# Patient Record
Sex: Male | Born: 1960 | Race: Black or African American | Hispanic: No | Smoking: Current every day smoker
Health system: Southern US, Community
[De-identification: ages and names within clinical notes are randomized; demographics above are authoritative.]

---

## 2017-08-10 ENCOUNTER — Ambulatory Visit (INDEPENDENT_AMBULATORY_CARE_PROVIDER_SITE_OTHER): Payer: Medicaid Other | Admitting: Pulmonary Disease

## 2017-08-10 ENCOUNTER — Ambulatory Visit (HOSPITAL_BASED_OUTPATIENT_CLINIC_OR_DEPARTMENT_OTHER)
Admission: RE | Admit: 2017-08-10 | Discharge: 2017-08-10 | Disposition: A | Discharge status: Home / Self Care | Payer: Medicaid Other | Source: Ambulatory Visit | Attending: Pulmonary Disease | Admitting: Pulmonary Disease

## 2017-08-10 ENCOUNTER — Encounter: Payer: Self-pay | Admitting: Pulmonary Disease

## 2017-08-10 VITALS — BP 124/86 | HR 85 | Ht 72.0 in | Wt 216.0 lb

## 2017-08-10 DIAGNOSIS — R918 Other nonspecific abnormal finding of lung field: Secondary | ICD-10-CM | POA: Diagnosis not present

## 2017-08-10 DIAGNOSIS — J441 Chronic obstructive pulmonary disease with (acute) exacerbation: Secondary | ICD-10-CM | POA: Diagnosis not present

## 2017-08-10 MED ORDER — FLUTICASONE-UMECLIDIN-VILANT 100-62.5-25 MCG/INH IN AEPB: 1.0000 | INHALATION_SPRAY | RESPIRATORY_TRACT | 3 refills | Status: DC

## 2017-08-10 NOTE — Progress Notes (Signed)
Subjective:   PATIENT ID: Philip Berry GENDER: male DOB: Feb 08, 1961, MRN: [id number]  Synopsis: Referred in April 2019 for COPD.  He has smoked 1/2 pack of cigarettes daily for the majority of his adult life and was still smoking as of April 2019.  He had previously been seen by pulmonary medicine in [facility].  He had a CT scan in 2014 that showed emphysema and scattered calcified pulmonary nodules.  He has a distant history of inhaled crack cocaine and marijuana use.  HPI  No chief complaint on file.  Chief complaint referred by primary care physician for evaluation of COPD  Philip Berry comes to my clinic today for evaluation of his COPD.  He says that he was diagnosed in 2006.  At that time he was told that he had emphysema and there was a concern that it was related to his prior inhaled crack cocaine and marijuana use.  He tells me today that he is not used inhaled illicit drugs in over 30 years.  However, with his COPD he has struggled with episodes of pneumonia in the past and was hospitalized in 2018 for left-sided chest pain and "possible pneumonia" associated with significant shortness of breath.  He says that as a child he had asthma which she "grew out of".  He did not have childhood pneumonia or significant respiratory infections.  He says that his shortness of breath causes some significant shortness of breath on exertion.  He does not have much problems with cough.  He says that he feels better in the mornings after he smokes 1 cigarette and then he "clears out his lung".  He has been trying to eat healthy lately and exercise more.  He feels some shortness of breath when climbing hills.  He has significantly cut back on smoking recently.  He used to smoke 1/2 pack of cigarettes daily for most of his adult life but 3 weeks prior to this visit he says he cut down to just 1 cigarette in the morning.  He has used multiple inhaled medicines over the years.  Most recently he has been  using Breo and Incruise.  He says that most of the time when he has more difficulty breathing is when he decides that he does not want to take his inhaled medicines.  He is interested in trying Trelegy for ease of use and cost.  He says that albuterol has never helped him much so he does not use it.  He does not even have an albuterol inhaler right now.  History reviewed. No pertinent past medical history.   History reviewed. No pertinent family history.   Social History   Socioeconomic History  . Marital status: Legally Separated    Spouse name: Not on file  . Number of children: Not on file  . Years of education: Not on file  . Highest education level: Not on file  Occupational History  . Not on file  Social Needs  . Financial resource strain: Not on file  . Food insecurity:    Worry: Not on file    Inability: Not on file  . Transportation needs:    Medical: Not on file    Non-medical: Not on file  Tobacco Use  . Smoking status: Current Some Day Smoker    Packs/day: 0.50    Years: 43.00    Pack years: 21.50    Types: Cigarettes  . Smokeless tobacco: Never Used  Substance and Sexual Activity  .  Alcohol use: Never    Frequency: Never  . Drug use: Never  . Sexual activity: Not on file  Lifestyle  . Physical activity:    Days per week: Not on file    Minutes per session: Not on file  . Stress: Not on file  Relationships  . Social connections:    Talks on phone: Not on file    Gets together: Not on file    Attends religious service: Not on file    Active member of club or organization: Not on file    Attends meetings of clubs or organizations: Not on file    Relationship status: Not on file  . Intimate partner violence:    Fear of current or ex partner: Not on file    Emotionally abused: Not on file    Physically abused: Not on file    Forced sexual activity: Not on file  Other Topics Concern  . Not on file  Social History Narrative  . Not on file     Not on  File   Outpatient Medications Prior to Visit  Medication Sig Dispense Refill  . amlodipine-atorvastatin (CADUET) 10-10 MG tablet Take 1 tablet by mouth daily.    Marland Kitchen losartan (COZAAR) 100 MG tablet Take 100 mg by mouth daily.    Marland Kitchen omeprazole (PRILOSEC) 40 MG capsule Take 40 mg by mouth daily.    . fluticasone furoate-vilanterol (BREO ELLIPTA) 100-25 MCG/INH AEPB Inhale 1 puff into the lungs daily.    Marland Kitchen umeclidinium bromide (INCRUSE ELLIPTA) 62.5 MCG/INH AEPB Inhale 1 puff into the lungs daily.     No facility-administered medications prior to visit.     Review of Systems  Constitutional: Negative for chills, fever, malaise/fatigue and weight loss.  HENT: Negative for congestion, nosebleeds, sinus pain and sore throat.   Eyes: Negative for photophobia, pain and discharge.  Respiratory: Positive for sputum production, shortness of breath and wheezing. Negative for cough and hemoptysis.   Cardiovascular: Negative for chest pain, palpitations, orthopnea and leg swelling.  Gastrointestinal: Negative for abdominal pain, constipation, diarrhea, nausea and vomiting.  Genitourinary: Negative for dysuria, frequency, hematuria and urgency.  Musculoskeletal: Negative for back pain, joint pain, myalgias and neck pain.  Skin: Negative for itching and rash.  Neurological: Negative for tingling, tremors, sensory change, speech change, focal weakness, seizures, weakness and headaches.  Psychiatric/Behavioral: Negative for memory loss, substance abuse and suicidal ideas. The patient is not nervous/anxious.       Objective:  Physical Exam   Vitals:   08/10/17 1606  BP: 124/86  Pulse: 85  SpO2: 94%  Weight: 216 lb (98 kg)  Height: 6' (1.829 m)    Gen: well appearing, no acute distress HENT: NCAT, OP clear, neck supple without masses Eyes: PERRL, EOMi Lymph: no cervical lymphadenopathy PULM: CTA B CV: RRR, no mgr, no JVD GI: BS+, soft, nontender, no hsm Derm: no rash or skin breakdown MSK:  normal bulk and tone Neuro: A&Ox4, CN II-XII intact, strength 5/5 in all 4 extremities Psyche: normal mood and affect   CBC No results found for: WBC, RBC, HGB, HCT, PLT, MCV, MCH, MCHC, RDW, LYMPHSABS, MONOABS, EOSABS, BASOSABS   Chest imaging: February 2019 chest x-ray result from [place] report says that there is multiple tiny calcified nodules which are unchanged June 2015 chest x-ray showed stable calcified bilateral pulmonary nodule February 2014 CT angiogram chest showed numerous tiny calcified granulomas bilaterally, moderate pulmonary emphysema, no pulmonary emboli  PFT:  Labs:  Path:  Echo:  Heart Catheterization:  Records from his visit with his primary care physician at [facility] were reviewed and he was referred to us for evaluation of COPD.     Assessment & Plan:   COPD with acute exacerbation (HCC) - Plan: DG Chest 2 View  Discussion: Mr. Philip Berry comes to me for evaluation of centrilobular emphysema which is causing ongoing shortness of breath.  He has done a good job of cutting back on smoking recently but continues to struggle with some dyspnea.  A 2014 CT scan of his chest showed multiple scattered pulmonary nodules as well as significant emphysema.  I do not doubt a diagnosis of COPD but unfortunately I cannot see record of a lung function test performed by his prior pulmonologist.  On the next visit we will need to get spirometry testing and check ambulatory oximetry monitoring.  He desires to change to a combination inhaler.  I explained to him today that this will not change the medications that he has been prescribed but will make it easier for him.  It is not clear to me if Medicaid will pay for Trelegy or not but we will go ahead and try to prescribe it.  He needs to quit smoking, we discussed this at length today.  He also needs to exercise more.  Plan:   COPD: Stop taking Incruise and Breo Start taking Trelegy 1 puff daily Use albuterol as needed for  chest tightness wheezing or shortness of breath, call us if you would like for us to prescribe this Exercise regularly Wash your hands frequently to prevent infection Get a flu shot every fall Check with your primary care doctor to see if you have been vaccinated against pneumonia  Allergic rhinitis: Use Neil Med rinses with distilled water at least twice per day using the instructions on the package. 1/2 hour after using the [facility] rinse, use Nasacort two puffs in each nostril once per day.  Remember that the Nasacort can take 1-2 weeks to work after regular use. Use generic zyrtec (cetirizine) every day.  If this doesn't help, then stop taking it and use chlorpheniramine-phenylephrine combination tablets.  Cigarette use: I strongly recommend that you quit smoking Reviewed the smoking cessation sheet we gave you  We will see you back in 2-3 weeks to see how you are doing with the Trelegy or sooner if needed    Current Outpatient Medications:  .  amlodipine-atorvastatin (CADUET) 10-10 MG tablet, Take 1 tablet by mouth daily., Disp: , Rfl:  .  losartan (COZAAR) 100 MG tablet, Take 100 mg by mouth daily., Disp: , Rfl:  .  omeprazole (PRILOSEC) 40 MG capsule, Take 40 mg by mouth daily., Disp: , Rfl:  .  Fluticasone-Umeclidin-Vilant (TRELEGY ELLIPTA) 100-62.5-25 MCG/INH AEPB, Inhale 1 puff into the lungs 1 day or 1 dose for 1 dose., Disp: 1 each, Rfl: 3

## 2017-08-10 NOTE — Patient Instructions (Signed)
COPD: Stop taking Incruise and Breo Start taking Trelegy 1 puff daily Use albuterol as needed for chest tightness wheezing or shortness of breath, call us if you would like for us to prescribe this Exercise regularly Wash your hands frequently to prevent infection Get a flu shot every fall Check with your primary care doctor to see if you have been vaccinated against pneumonia  Allergic rhinitis: Use Neil Med rinses with distilled water at least twice per day using the instructions on the package. 1/2 hour after using the [facility] rinse, use Nasacort two puffs in each nostril once per day.  Remember that the Nasacort can take 1-2 weeks to work after regular use. Use generic zyrtec (cetirizine) every day.  If this doesn't help, then stop taking it and use chlorpheniramine-phenylephrine combination tablets.  Cigarette use: I strongly recommend that she quit smoking Reviewed the smoking cessation sheet we gave you  We will see you back in 2-3 weeks to see how you are doing with the Trelegy or sooner if needed

## 2017-08-11 ENCOUNTER — Telehealth: Payer: Self-pay | Admitting: Pulmonary Disease

## 2017-08-11 MED ORDER — TRIAMCINOLONE ACETONIDE 55 MCG/ACT NA AERO: 2.0000 | INHALATION_SPRAY | Freq: Every day | NASAL | 5 refills | Status: AC

## 2017-08-11 NOTE — Telephone Encounter (Signed)
  Preferred Medications Advair Dulera or Symbicort [place] [facility]  I called [facility] for PA on Trelegy. Approved through 08/12/2018. I called pt to let him know and to check with his pharmacy because it may take a few minutes to go through on their end. Pt understood. Nothing further is needed.

## 2017-08-11 NOTE — Telephone Encounter (Signed)
BQ can we send in Rx for Flonase, you recommended Nasacort. Called pt and gave BQ recommendations. He states he is ok with Nasacort. R xsent. Nothing further is needed.    Allergic rhinitis: Use Neil Med rinses with distilled water at least twice per day using the instructions on the package. 1/2 hour after using the [facility] rinse, use Nasacort two puffs in each nostril once per day.  Remember that the Nasacort can take 1-2 weeks to work after regular use. Use generic zyrtec (cetirizine) every day.  If this doesn't help, then stop taking it and use chlorpheniramine-phenylephrine combination tablets.

## 2017-08-31 ENCOUNTER — Ambulatory Visit: Payer: Medicaid Other | Admitting: Adult Health

## 2017-11-27 ENCOUNTER — Other Ambulatory Visit: Payer: Self-pay | Admitting: Pulmonary Disease

## 2018-04-09 ENCOUNTER — Telehealth: Payer: Self-pay | Admitting: Pulmonary Disease

## 2018-04-09 MED ORDER — FLUTICASONE-UMECLIDIN-VILANT 100-62.5-25 MCG/INH IN AEPB: 1.0000 | INHALATION_SPRAY | Freq: Every day | RESPIRATORY_TRACT | 0 refills | Status: DC

## 2018-04-09 NOTE — Telephone Encounter (Signed)
Called and psoke to pt, who is requesting refills on Trelegy.  Pt last seen 08/10/17 and was instructed to f/u in 2-3 weeks.  F/u appt for 08/31/17 was canceled.  Pt has been scheduled for ov with BQ on 04/19/18. 1 Rx of Trelegy has been sent to preferred pharamcy to get pt by until next OV.  Nothing further is needed.

## 2018-04-19 ENCOUNTER — Ambulatory Visit (INDEPENDENT_AMBULATORY_CARE_PROVIDER_SITE_OTHER): Payer: Medicaid Other | Admitting: Pulmonary Disease

## 2018-04-19 ENCOUNTER — Encounter: Payer: Self-pay | Admitting: Pulmonary Disease

## 2018-04-19 VITALS — BP 116/74 | HR 99 | Ht 72.0 in | Wt 207.6 lb

## 2018-04-19 DIAGNOSIS — R0600 Dyspnea, unspecified: Secondary | ICD-10-CM

## 2018-04-19 DIAGNOSIS — F17219 Nicotine dependence, cigarettes, with unspecified nicotine-induced disorders: Secondary | ICD-10-CM

## 2018-04-19 DIAGNOSIS — R5381 Other malaise: Secondary | ICD-10-CM | POA: Diagnosis not present

## 2018-04-19 DIAGNOSIS — J449 Chronic obstructive pulmonary disease, unspecified: Secondary | ICD-10-CM | POA: Diagnosis not present

## 2018-04-19 MED ORDER — NICOTINE 10 MG IN INHA: 1.0000 | RESPIRATORY_TRACT | 2 refills | Status: AC | PRN

## 2018-04-19 NOTE — Progress Notes (Signed)
Subjective:   PATIENT ID: Philip Berry GENDER: male DOB: 12/19/1960, MRN: [id number]  Synopsis: Referred in April 2019 for COPD.  He has smoked 1/2 pack of cigarettes daily for the majority of his adult life and was still smoking as of April 2019.  He had previously been seen by pulmonary medicine in [facility].  He had a CT scan in 2014 that showed emphysema and scattered calcified pulmonary nodules.  He has a distant history of inhaled crack cocaine and marijuana use.  HPI  Chief Complaint  Patient presents with  . Follow-up    pt has good and bad days.  c/o stable sob with exertion, sinus congestion, pnd, prod cough with clear/white mucus.     Philip Berry was sick a few months ago after he had the flu shot.  He had to go to his PCP and he ended up taking prednisone and antibiotics.  Unfortunately he ended up in the ER.  He has some dyspnea with activity, he can walk 2 blocks without stopping.  He says that he cannot climb a flight of stairs without stopping and he has to do this regularly.  He has wheezing and chest tightness a lot.  He says that vacuuming and mopping.  He can wash clothes but he cannot carry a basket of laundry.  He says that when he tries to climb stairs he get in enough air.      History reviewed. No pertinent past medical history.    Review of Systems  Constitutional: Negative for chills, fever, malaise/fatigue and weight loss.  HENT: Negative for congestion, nosebleeds, sinus pain and sore throat.   Eyes: Negative for photophobia, pain and discharge.  Respiratory: Positive for sputum production, shortness of breath and wheezing. Negative for cough and hemoptysis.   Cardiovascular: Negative for chest pain, palpitations, orthopnea and leg swelling.  Gastrointestinal: Negative for abdominal pain, constipation, diarrhea, nausea and vomiting.  Genitourinary: Negative for dysuria, frequency, hematuria and urgency.  Musculoskeletal: Negative for back pain, joint pain,  myalgias and neck pain.  Skin: Negative for itching and rash.  Neurological: Negative for tingling, tremors, sensory change, speech change, focal weakness, seizures, weakness and headaches.  Psychiatric/Behavioral: Negative for memory loss, substance abuse and suicidal ideas. The patient is not nervous/anxious.       Objective:  Physical Exam   Vitals:   04/19/18 1511  BP: 116/74  Pulse: 99  SpO2: 97%  Weight: 207 lb 9.6 oz (94.2 kg)  Height: 6' (1.829 m)    Gen: chronically ill appearing HENT: OP clear, TM's clear, neck supple PULM: Poor air movement B, normal percussion CV: RRR, no mgr, trace edema GI: BS+, soft, nontender Derm: no cyanosis or rash Psyche: normal mood and affect   CBC No results found for: WBC, RBC, HGB, HCT, PLT, MCV, MCH, MCHC, RDW, LYMPHSABS, MONOABS, EOSABS, BASOSABS   Chest imaging: February 2019 chest x-ray result from [facility] report says that there is multiple tiny calcified nodules which are unchanged June 2015 chest x-ray showed stable calcified bilateral pulmonary nodule February 2014 CT angiogram chest showed numerous tiny calcified granulomas bilaterally, moderate pulmonary emphysema, no pulmonary emboli 07/2017 chest x-ray images independently reviewed showing numerous tiny calcified granulomas in the right lung, some of the left, emphysema noted  PFT: December 2019 ratio 40%, FEV1 0.7 L 19% predicted  Labs:  Path:  Echo:  Heart Catheterization:  Records from his visit with his primary care physician at [facility] were reviewed and he was  referred to us for evaluation of COPD.     Assessment & Plan:   Chronic obstructive pulmonary disease, unspecified COPD type (HCC) - Plan: Alpha-1 antitrypsin phenotype, Ambulatory referral to Physical Therapy, Spirometry with Graph  Cigarette nicotine dependence with nicotine-induced disorder  Physical deconditioning  Dyspnea, unspecified type  Discussion: I have not seen Philip Berry in about 2  years and unfortunately he continues to smoke cigarettes and have COPD exacerbations from time to time.  He has had progressive dyspnea on exertion and significant functional limitation due to his severe COPD.  He cannot climb a flight of stairs to get into his apartment so I think it is important for us to help him move to an apartment without stairs.  That being said, I do feel that he could get better with regular exercise.  It is also very important for him to stop making his COPD worse by quitting smoking.  We talked about the importance of this today.  Plan: Cigarette smoking: It is important for you to stop Call 1-800-quit-now to get free nicotine replacement from the state of [place] Try using the Nicotrol inhaler that we prescribed to help quit smoking  Severe COPD: I am glad that your flu shot is up-to-date Keep taking Trelegy 1 puff daily no matter how you feel Use albuterol as needed for chest tightness wheezing or shortness of breath Stay active Practice good hand hygiene Spirometry test today We will check your oxygen level while walking today Check alpha 1 anti trypsin level  Physical deconditioning with severe shortness of breath: I am going to refer you to a physical therapist to help with pulmonary rehab I think it would be helpful for you to get a high protein diet: 100 to 120 g of protein a day  We will see you back in 2-3 months or sooner if needed  > 50% of this 30 min visit spent face to face   Current Outpatient Medications:  .  amlodipine-atorvastatin (CADUET) 10-10 MG tablet, Take 1 tablet by mouth daily., Disp: , Rfl:  .  Fluticasone-Umeclidin-Vilant (TRELEGY ELLIPTA) 100-62.5-25 MCG/INH AEPB, Take 1 puff by mouth daily., Disp: 60 each, Rfl: 0 .  losartan (COZAAR) 100 MG tablet, Take 100 mg by mouth daily., Disp: , Rfl:  .  omeprazole (PRILOSEC) 40 MG capsule, Take 40 mg by mouth daily., Disp: , Rfl:  .  nicotine (NICOTROL) 10 MG inhaler, Inhale 1  Cartridge (1 continuous puffing total) into the lungs as needed for smoking cessation., Disp: 42 each, Rfl: 2 .  triamcinolone (NASACORT) 55 MCG/ACT AERO nasal inhaler, Place 2 sprays into the nose daily. (Patient not taking: Reported on 04/19/2018), Disp: 1 Inhaler, Rfl: 5

## 2018-04-19 NOTE — Addendum Note (Signed)
Addended by: Demetrio LappingSANTOS, Ciana Simmon E on: 04/19/2018 03:53 PM   Modules accepted: Orders

## 2018-04-19 NOTE — Patient Instructions (Signed)
Cigarette smoking: It is important for you to stop Call 1-800-quit-now to get free nicotine replacement from the state [place] Try using the Nicotrol inhaler that we prescribed to help quit smoking  Severe COPD: I am glad that your flu shot is up-to-date Keep taking Trelegy 1 puff daily no matter how you feel Use albuterol as needed for chest tightness wheezing or shortness of breath Stay active Practice good hand hygiene Spirometry test today We will check your oxygen level while walking today Check alpha 1 anti trypsin level  Physical deconditioning with severe shortness of breath: I am going to refer you to a physical therapist to help with pulmonary rehab I think it would be helpful for you to get a high protein diet: 100 to 120 g of protein a day  We will see you back in 2-3 months or sooner if needed

## 2018-04-20 ENCOUNTER — Telehealth: Payer: Self-pay | Admitting: Pulmonary Disease

## 2018-04-20 NOTE — Telephone Encounter (Signed)
Yes, you can write a letter that says he has severe COPD and severe shortness of breath and cannot climb stairs because of this. He should be given another apartment that does not require him to climb stairs.

## 2018-04-20 NOTE — Telephone Encounter (Signed)
Patient returned call, CB is [phone].

## 2018-04-20 NOTE — Telephone Encounter (Signed)
Call made to patient, made aware letter has been printed. Patient requesting that we mail the letter to him. Letter signed and placed in envelope for out going mail. Voiced understanding. Nothing further is needed at this time.

## 2018-04-20 NOTE — Telephone Encounter (Signed)
Called and spoke with patient advised him that we have started the PA. Patient also stated that the letter that he is needing needs to state that he needs to move down to the first floor of his apartment due to having COPD and he cannot take the stairs. BQ please advise on this, thank you.

## 2018-04-20 NOTE — Telephone Encounter (Signed)
Called patient, unable to reach. Left message to give us a call back. I have started the PA on [email or web address]  Medication name and strength: Nicotrol Inhaler Provider: Dr. Kendrick FriesMcQuaid Pharmacy: [facility] Patient insurance ID: [id number] N Phone: [phone] Fax: [phone]  Was the PA started on CMM?  yes If yes, please enter the Key: [id number] Timeframe for approval/denial: 48-72 hours

## 2018-04-24 LAB — ALPHA-1 ANTITRYPSIN PHENOTYPE: A-1 Antitrypsin, Ser: 149 mg/dL (ref 83–199)

## 2018-05-07 ENCOUNTER — Telehealth: Payer: Self-pay | Admitting: Pulmonary Disease

## 2018-05-07 MED ORDER — FLUTICASONE-UMECLIDIN-VILANT 100-62.5-25 MCG/INH IN AEPB: 1.0000 | INHALATION_SPRAY | Freq: Every day | RESPIRATORY_TRACT | 2 refills | Status: DC

## 2018-05-07 NOTE — Telephone Encounter (Signed)
Called and spoke with Patient. He was requesting a refill for Trelegy to be sent to [facility] in [facility].  Refill sent to preferred pharmacy. Nothing further at  This time. Per BQ 04/19/18- Keep taking Trelegy 1 puff daily no matter how you feel

## 2018-06-27 ENCOUNTER — Ambulatory Visit: Payer: Medicaid Other | Admitting: Pulmonary Disease

## 2018-07-02 ENCOUNTER — Telehealth: Payer: Self-pay | Admitting: Pulmonary Disease

## 2018-07-02 MED ORDER — FLUTICASONE-UMECLIDIN-VILANT 100-62.5-25 MCG/INH IN AEPB: 1.0000 | INHALATION_SPRAY | Freq: Every day | RESPIRATORY_TRACT | 0 refills | Status: AC

## 2018-07-02 NOTE — Telephone Encounter (Signed)
Returned call to patient, made aware PA has been initiated. Patient states so until then I am supposed to just not be able to breath. I informed the patient if he needed a sample to tie him over he could ask and we could mail him a patient assistance application.  Sample and application placed upfront for pick up.   Call made to [facility]. PA [place], [place] # [phone]. Per rep please allow 24 hours for a decision to be made.   Will route message to nurse to hold until PA decision has been made.

## 2018-07-02 NOTE — Telephone Encounter (Signed)
Per Robynn Pane, will route this to [employer].

## 2018-07-03 NOTE — Telephone Encounter (Signed)
Spoke with pt, advised him that we could not mail samples. Pt understood and stated he would come up here Monday to pick up sample.

## 2018-07-03 NOTE — Telephone Encounter (Signed)
Patient not able to come pickup the Trelegy sample.  Would like it mailed to him. Patient phone number [phone].

## 2018-07-05 NOTE — Telephone Encounter (Signed)
Called and spoke with patient he stated that he will be here Monday to pick up samples. Will leave open.

## 2018-07-12 NOTE — Telephone Encounter (Signed)
Called and spoke with pt asking if he had been able to come by office yet to pick up the sample of Trelegy and pt stated he had not been able to do so yet due to transportation issues. Pt did state to me that he has an appt today that is near our way so he was planning on coming by office today to pick up the sample.  I asked pt if he had heard anything from insurance in regards to the PA that was initiated and he stated to me that he had received a message stating that he should be able to pick up the Rx at the pharmacy.  Called [employer] at [phone] and spoke with Thurmond Butts to check status of the PA. Per Thurmond Butts, [place] was approved from 07/02/2018-06/27/2019. Nothing further needed.

## 2018-07-28 ENCOUNTER — Other Ambulatory Visit: Payer: Self-pay | Admitting: Pulmonary Disease

## 2018-10-01 ENCOUNTER — Other Ambulatory Visit: Payer: Self-pay | Admitting: Pulmonary Disease

## 2018-12-07 ENCOUNTER — Other Ambulatory Visit: Payer: Self-pay | Admitting: Pulmonary Disease

## 2019-10-30 IMAGING — DX DG CHEST 2V
2 series · 2 of 2 positions shown · non-contrast
Comparison: No prior.

CLINICAL DATA: COPD.

EXAM:
CHEST - 2 VIEW

[chest pa]
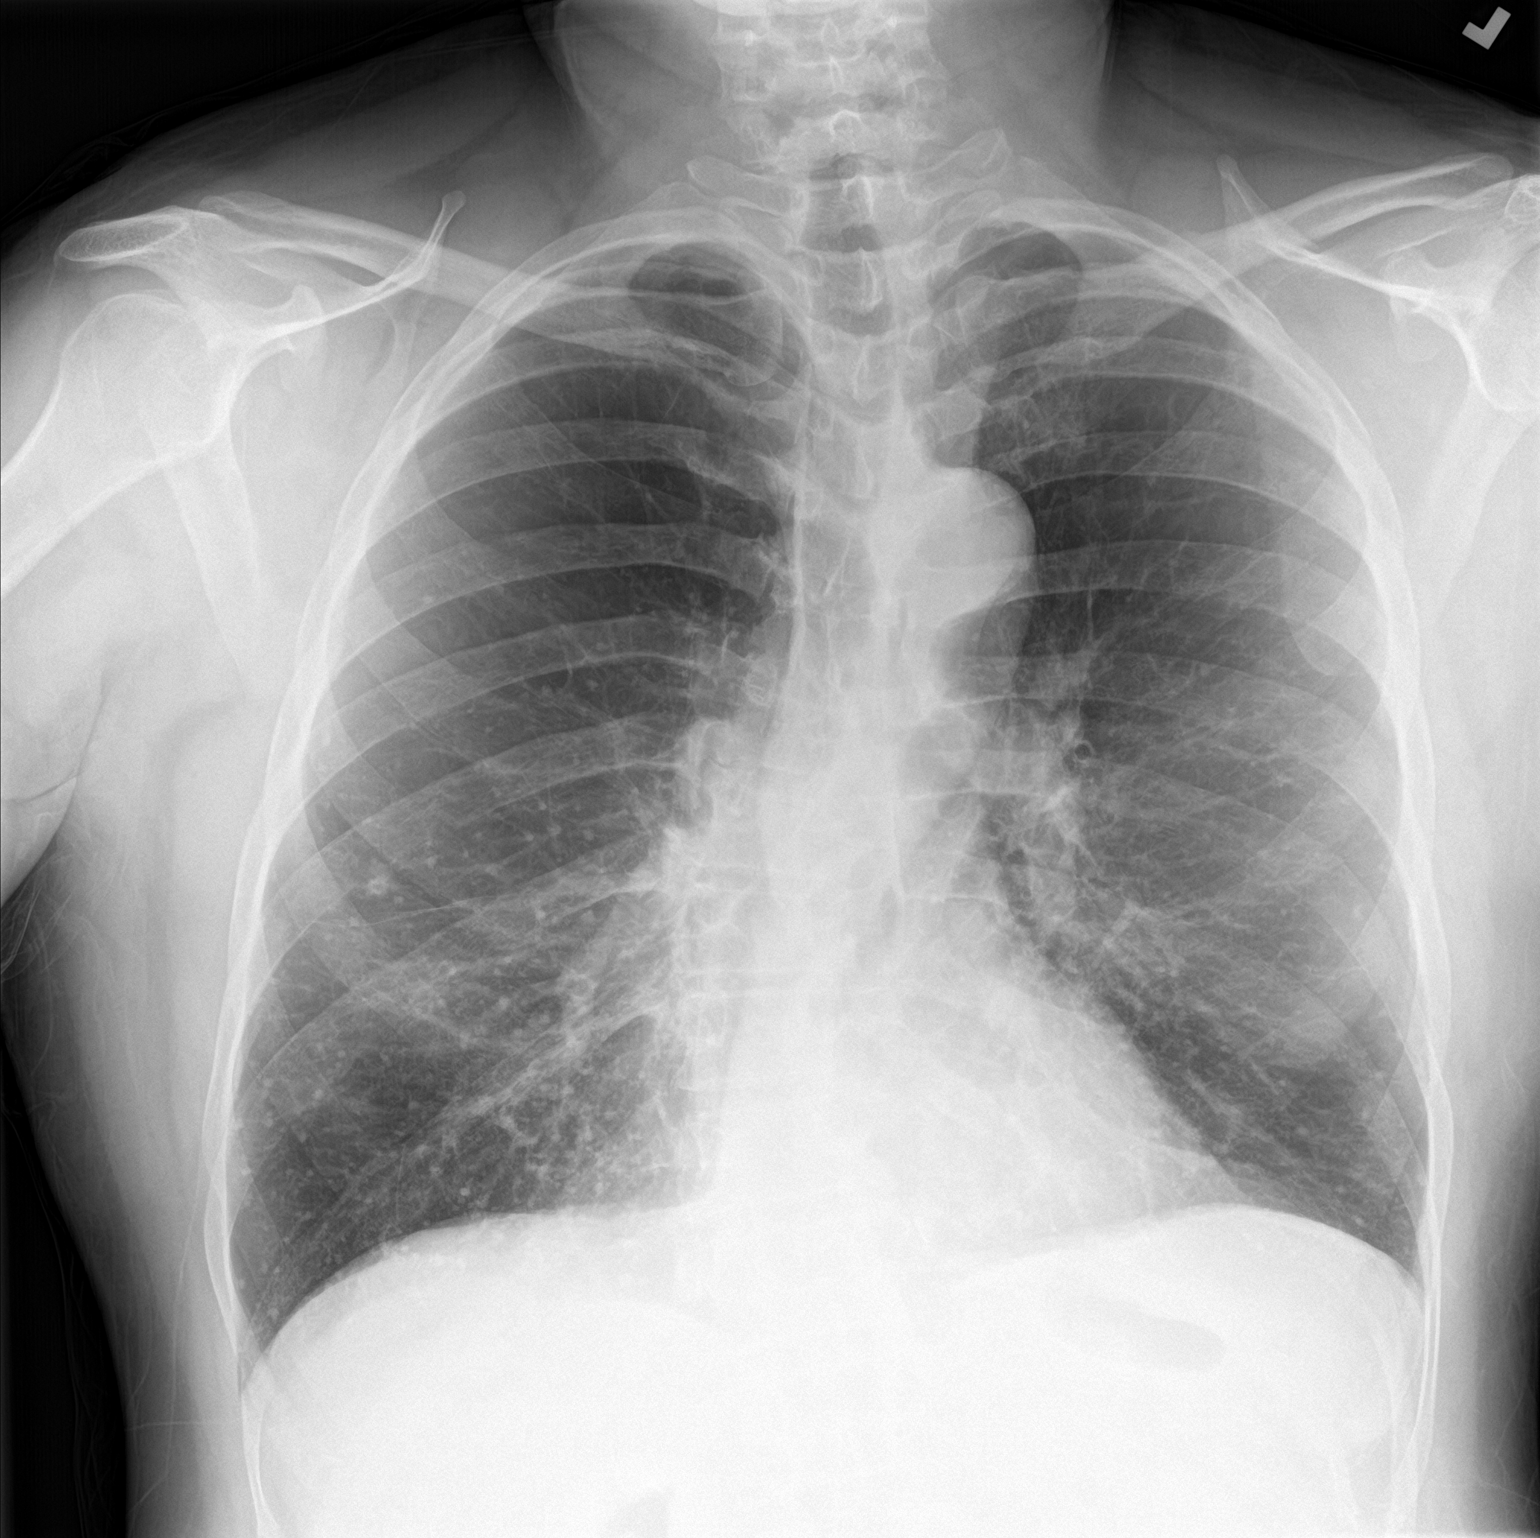

[chest lat]
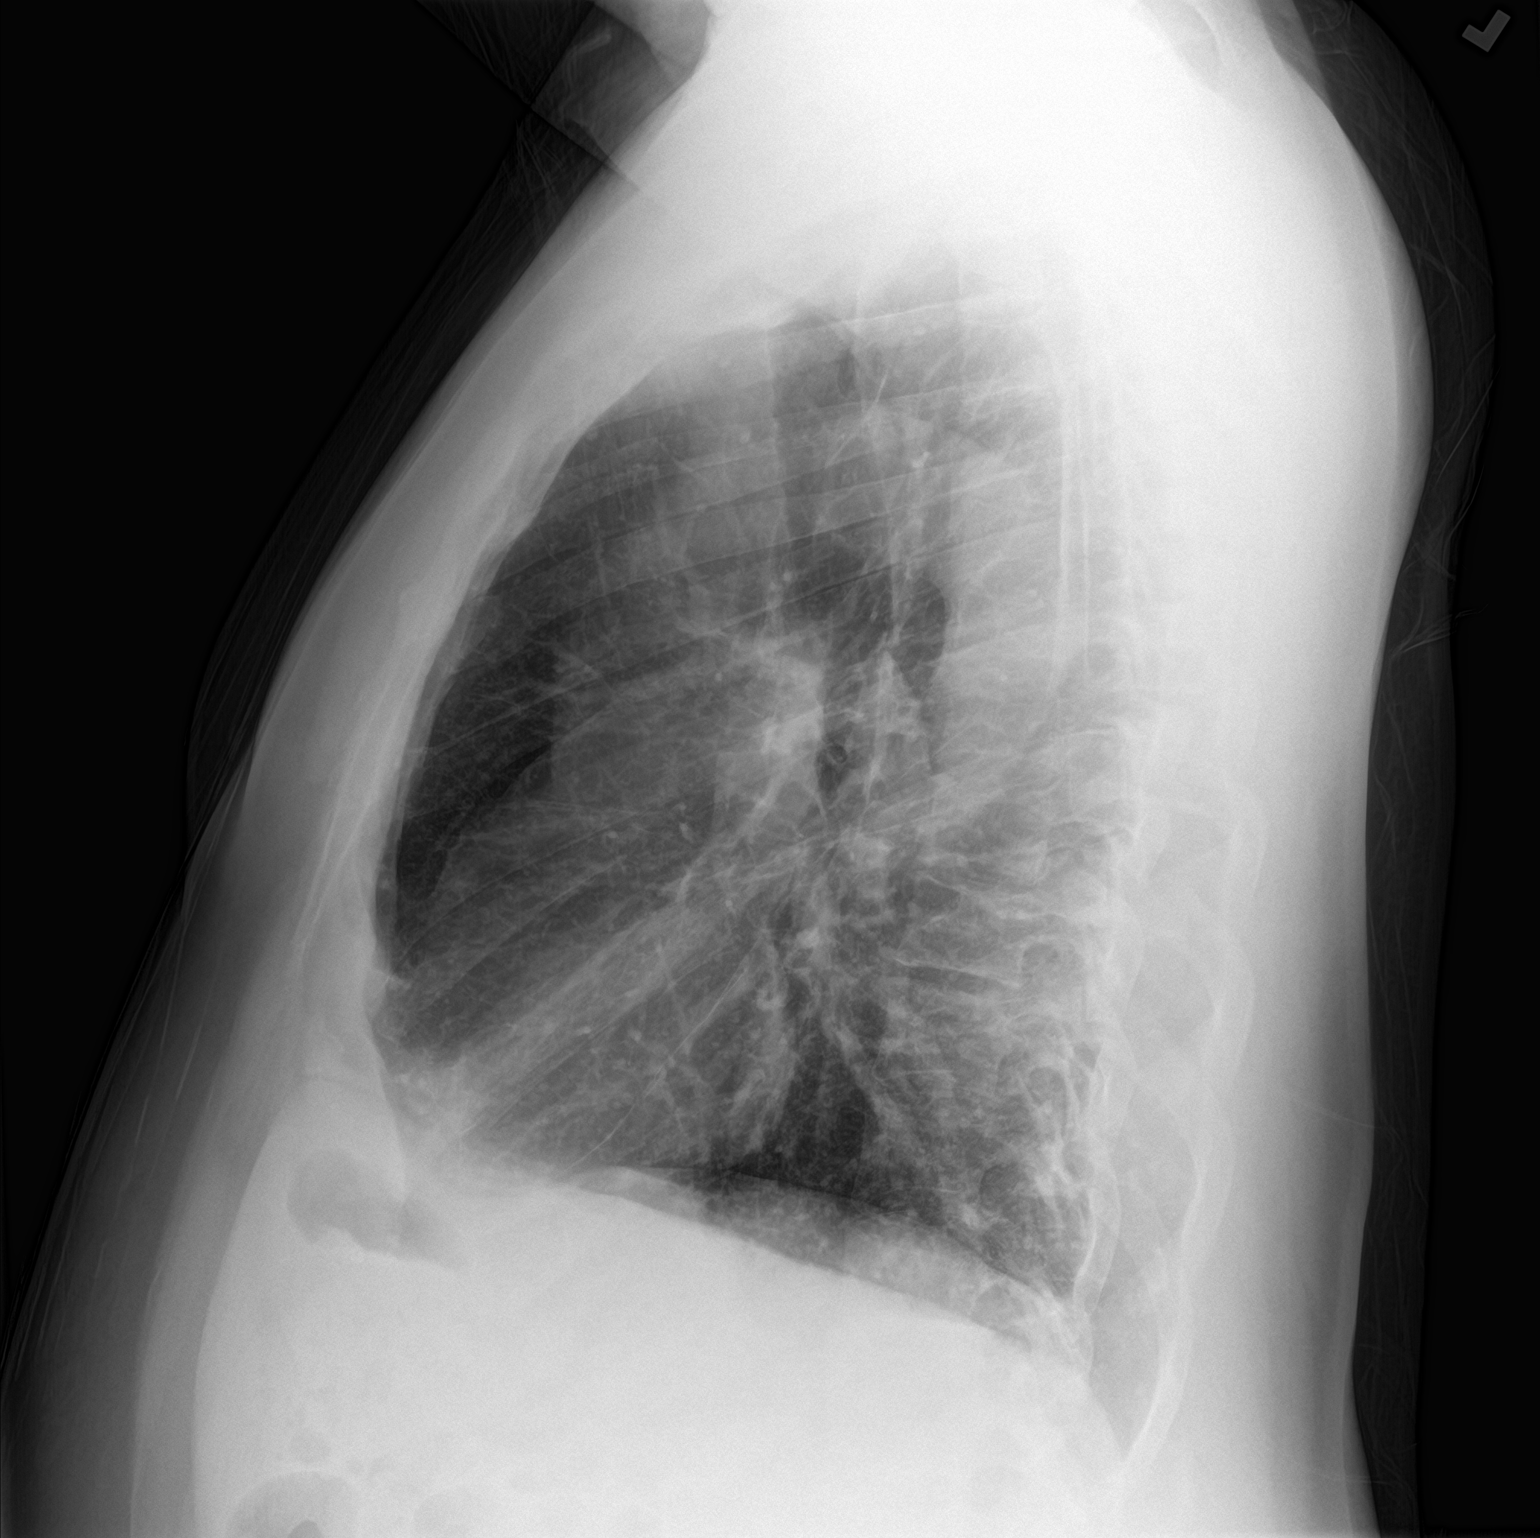

[2 of 2 positions shown; findings below may reference images not displayed]

FINDINGS: Mediastinum hilar structures normal. Heart size normal. Tiny
bilateral calcified pulmonary nodular densities are noted most
likely related to old prior viral granulomas infection. Process such
as silicosis could also present in this fashion. No pleural effusion
or pneumothorax. Heart size normal. Thoracic spine scoliosis.
IMPRESSION: Multiple tiny calcified pulmonary nodules noted. These are most
likely related old viral or granulomatous infection.
